# Patient Record
Sex: Male | Born: 1975 | Race: White | Hispanic: No | Marital: Married
Health system: Southern US, Community
[De-identification: ages and names within clinical notes are randomized; demographics above are authoritative.]

## PROBLEM LIST (undated history)

## (undated) DIAGNOSIS — I10 Essential (primary) hypertension: Secondary | ICD-10-CM

## (undated) DIAGNOSIS — F988 Other specified behavioral and emotional disorders with onset usually occurring in childhood and adolescence: Secondary | ICD-10-CM

## (undated) HISTORY — PX: NASAL SINUS SURGERY: SHX719

## (undated) HISTORY — PX: KNEE SURGERY: SHX244

## (undated) HISTORY — PX: BACK SURGERY: SHX140

## (undated) HISTORY — PX: ANKLE SURGERY: SHX546

---

## 2019-01-17 ENCOUNTER — Emergency Department (INDEPENDENT_AMBULATORY_CARE_PROVIDER_SITE_OTHER): Payer: BC Managed Care – PPO

## 2019-01-17 ENCOUNTER — Encounter: Payer: Self-pay | Admitting: Emergency Medicine

## 2019-01-17 ENCOUNTER — Other Ambulatory Visit: Payer: Self-pay

## 2019-01-17 ENCOUNTER — Emergency Department (INDEPENDENT_AMBULATORY_CARE_PROVIDER_SITE_OTHER)
Admission: EM | Admit: 2019-01-17 | Discharge: 2019-01-17 | Disposition: A | Discharge status: Home / Self Care | Payer: BC Managed Care – PPO | Source: Home / Self Care | Attending: Family Medicine | Admitting: Family Medicine

## 2019-01-17 DIAGNOSIS — S93402A Sprain of unspecified ligament of left ankle, initial encounter: Secondary | ICD-10-CM | POA: Diagnosis not present

## 2019-01-17 DIAGNOSIS — S86312A Strain of muscle(s) and tendon(s) of peroneal muscle group at lower leg level, left leg, initial encounter: Secondary | ICD-10-CM

## 2019-01-17 DIAGNOSIS — M25572 Pain in left ankle and joints of left foot: Secondary | ICD-10-CM | POA: Diagnosis not present

## 2019-01-17 HISTORY — DX: Essential (primary) hypertension: I10

## 2019-01-17 HISTORY — DX: Other specified behavioral and emotional disorders with onset usually occurring in childhood and adolescence: F98.8

## 2019-01-17 MED ORDER — HYDROCODONE-ACETAMINOPHEN 5-325 MG PO TABS: ORAL_TABLET | ORAL | 0 refills | Status: AC

## 2019-01-17 MED ORDER — IBUPROFEN 600 MG PO TABS
600.0000 mg | ORAL_TABLET | Freq: Once | ORAL | Status: AC
  Administered 2019-01-17: 600 mg via ORAL

## 2019-01-17 NOTE — Discharge Instructions (Addendum)
Apply ice pack for 30 minutes every 1 to 2 hours today and tomorrow.  Elevate.  Use crutches for 3 to 5 days.  Wear brace for about 2 to 3 weeks.  Begin range of motion and stretching exercises in about 5 days as per instruction sheet. May take Ibuprofen 200mg , 4 tabs every 8 hours with food.

## 2019-01-17 NOTE — ED Triage Notes (Signed)
Patient was sprint-running 3 days ago and since then left lateral ankle pain worsening. Took ibuprofen 800 mg at 0400.  Has had influenza vacc this season. No known contact with covid positive person.

## 2019-01-17 NOTE — ED Provider Notes (Signed)
[facility]    CSN: [id number] Arrival date & time: 01/17/19  1116      History   Chief Complaint Chief Complaint  Patient presents with  . Ankle Injury    left    HPI Jesse Khan is a 44 y.o. male.   After sprinting three days ago, patient experienced pain in his left lateral ankle although he recalls no injury.  The pain is worse with weight bearing and not improved with ibuprofen.  The history is provided by the patient.  Ankle Pain Location:  Ankle Time since incident:  3 days Injury: no   Ankle location:  L ankle Pain details:    Quality:  Aching   Radiates to:  Does not radiate   Severity:  Moderate   Onset quality:  Sudden   Duration:  3 days   Timing:  Constant   Progression:  Unchanged Chronicity:  New Prior injury to area:  No Relieved by:  Nothing Worsened by:  Activity, bearing weight and exercise Ineffective treatments:  NSAIDs Associated symptoms: stiffness and swelling   Associated symptoms: no decreased ROM, no muscle weakness and no numbness     Past Medical History:  Diagnosis Date  . ADD (attention deficit disorder)   . Hypertension     There are no problems to display for this patient.   Past Surgical History:  Procedure Laterality Date  . ANKLE SURGERY Right   . BACK SURGERY    . KNEE SURGERY Right   . NASAL SINUS SURGERY         Home Medications    Prior to Admission medications   Medication Sig Start Date End Date Taking? Authorizing Provider  amitriptyline (ELAVIL) 10 MG tablet Take 5 mg by mouth at bedtime.   Yes [provider]  amlodipine-atorvastatin (CADUET) 10-20 MG tablet Take 1 tablet by mouth daily.   Yes [provider]  amphetamine-dextroamphetamine (ADDERALL) 30 MG tablet Take 30 mg by mouth 2 (two) times daily.   Yes [provider]    Family History No family history on file.  Social History Social History   Tobacco Use  . Smoking status: Not on file    Substance Use Topics  . Alcohol use: Not on file  . Drug use: Not on file     Allergies   Patient has no known allergies.   Review of Systems Review of Systems  Musculoskeletal: Positive for stiffness.  All other systems reviewed and are negative.    Physical Exam Triage Vital Signs ED Triage Vitals  Enc Vitals Group     BP 01/17/19 1410 (!) 138/93     Pulse Rate 01/17/19 1410 84     Resp 01/17/19 1410 16     Temp 01/17/19 1410 98.4 F (36.9 C)     Temp Source 01/17/19 1410 Oral     SpO2 01/17/19 1410 96 %     Weight 01/17/19 1411 215 lb (97.5 kg)     Height 01/17/19 1411 6' (1.829 m)     Head Circumference --      Peak Flow --      Pain Score 01/17/19 1410 8     Pain Loc --      Pain Edu? --      Excl. in [place]? --    No data found.  Updated Vital Signs BP (!) 138/93 (BP Location: Right Arm)   Pulse 84   Temp 98.4 F (36.9 C) (Oral)   Resp  16   Ht 6' (1.829 m)   Wt 97.5 kg   SpO2 96%   BMI 29.16 kg/m   Visual Acuity Right Eye Distance:   Left Eye Distance:   Bilateral Distance:    Right Eye Near:   Left Eye Near:    Bilateral Near:     Physical Exam Vitals and nursing note reviewed.  Constitutional:      General: He is not in acute distress. HENT:     Head: Normocephalic.  Eyes:     Pupils: Pupils are equal, round, and reactive to light.  Cardiovascular:     Rate and Rhythm: Normal rate.  Pulmonary:     Effort: Pulmonary effort is normal.     Breath sounds: Normal breath sounds.  Musculoskeletal:     Cervical back: Normal range of motion.     Right lower leg: No edema.     Left lower leg: No edema.       Feet:     Comments: Left ankle:  Decreased range of motion.  Tenderness and swelling over the lateral malleolus.  Joint stable.  No tenderness over the base of the fifth metatarsal.  Distal neurovascular function is intact.   There is tenderness over the course of the left peroneal tendon.  Pain is elicited with resisted eversion and  resisted plantar flexion of the ankle.   Skin:    General: Skin is warm and dry.  Neurological:     Mental Status: He is alert.      UC Treatments / Results  Labs (all labs ordered are listed, but only abnormal results are displayed) Labs Reviewed - No data to display  EKG   Radiology DG Ankle Complete Left  Result Date: 01/17/2019 CLINICAL DATA:  Running injury 3 days ago with pain, initial encounter EXAM: LEFT ANKLE COMPLETE - 3+ VIEW COMPARISON:  None. FINDINGS: Mild soft tissue swelling is noted laterally. No acute fracture or dislocation is noted. No other focal abnormality is seen. IMPRESSION: Mild soft tissue swelling without acute bony abnormality. Electronically Signed   By: Alcide Clever M.D.   On: 01/17/2019 14:30    Procedures Procedures (including critical care time)  Medications Ordered in UC Medications  ibuprofen (ADVIL) tablet 600 mg (600 mg Oral Given 01/17/19 1409)    Initial Impression / Assessment and Plan / UC Course  I have reviewed the triage vital signs and the nursing notes.  Pertinent labs & imaging results that were available during my care of the patient were reviewed by me and considered in my medical decision making (see chart for details).    Dispensed [employer] stirrup splint. Rx for Lortab at bedtime (#5, no ref) Controlled Substance Prescriptions I have consulted the [place] Controlled Substances Registry for this patient, and feel the risk/benefit ratio today is favorable for proceeding with this prescription for a controlled substance.   Patient has crutches at home. Followup with Dr[facility] ([facility]) if not improving about two weeks.   Final Clinical Impressions(s) / UC Diagnoses   Final diagnoses:  Sprain of left ankle, unspecified ligament, initial encounter  Strain of peroneal tendon of left foot, initial encounter     Discharge Instructions     Apply ice pack for 30 minutes every 1 to 2 hours today and  tomorrow.  Elevate.  Use crutches for 3 to 5 days.  Wear brace for about 2 to 3 weeks.  Begin range of motion and stretching exercises in about 5  days as per instruction sheet. May take Ibuprofen 200mg , 4 tabs every 8 hours with food.      ED Prescriptions    None        , MD 01/24/19 [phone]

## 2020-08-06 IMAGING — DX DG ANKLE COMPLETE 3+V*L*
3 series · 3 of 3 positions shown · non-contrast
Comparison: None.

CLINICAL DATA: Running injury 3 days ago with pain, initial
encounter

EXAM:
LEFT ANKLE COMPLETE - 3+ VIEW

[ankle ap]
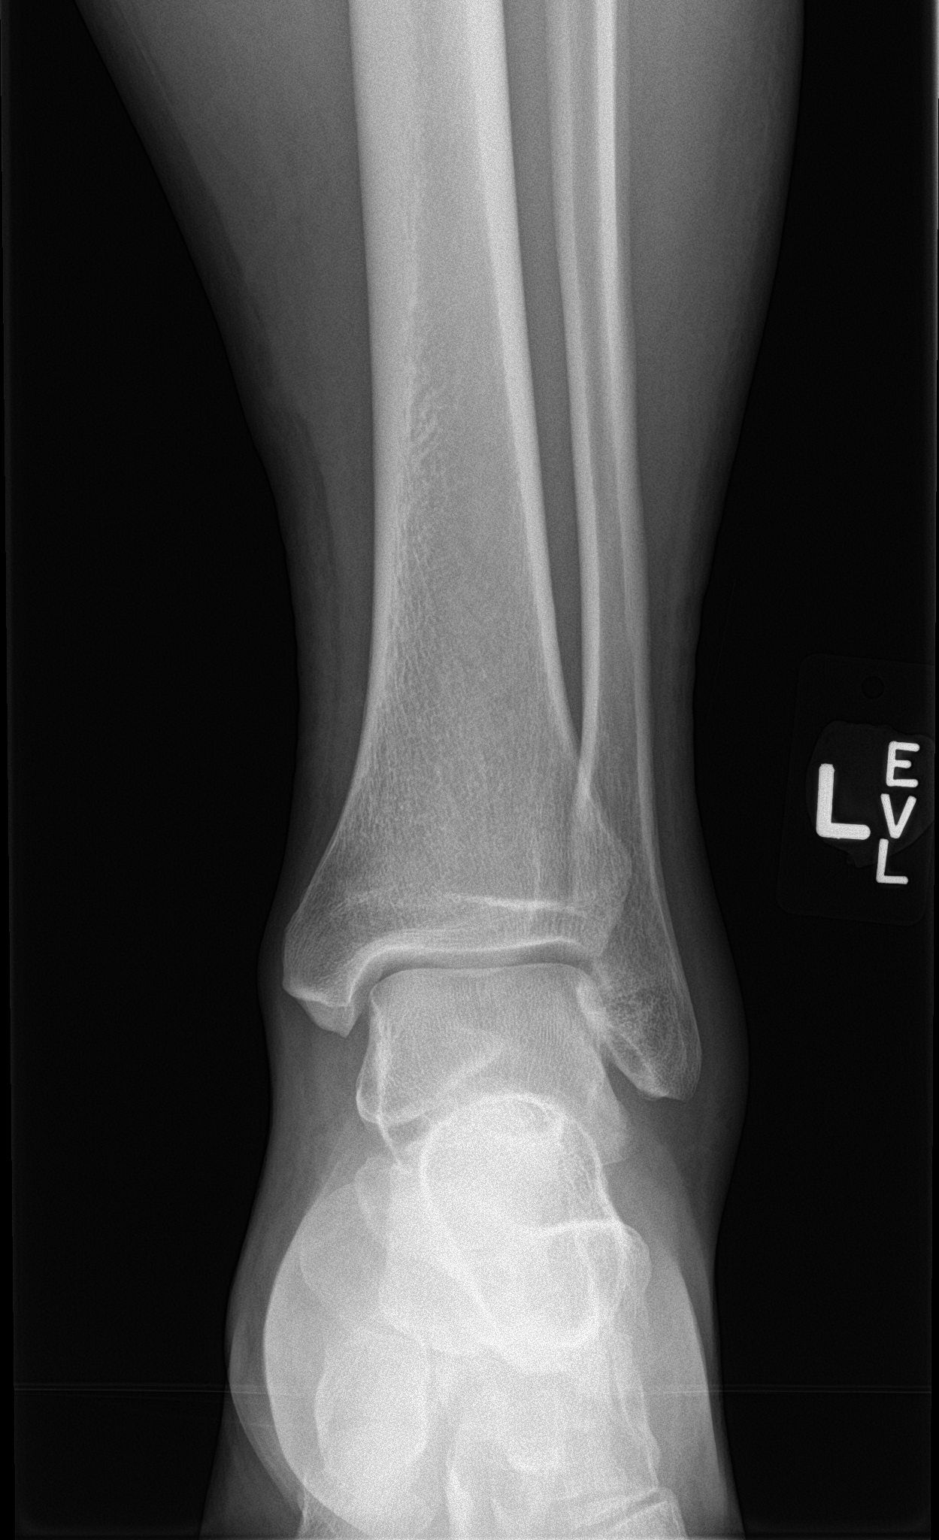

[ankle obl]
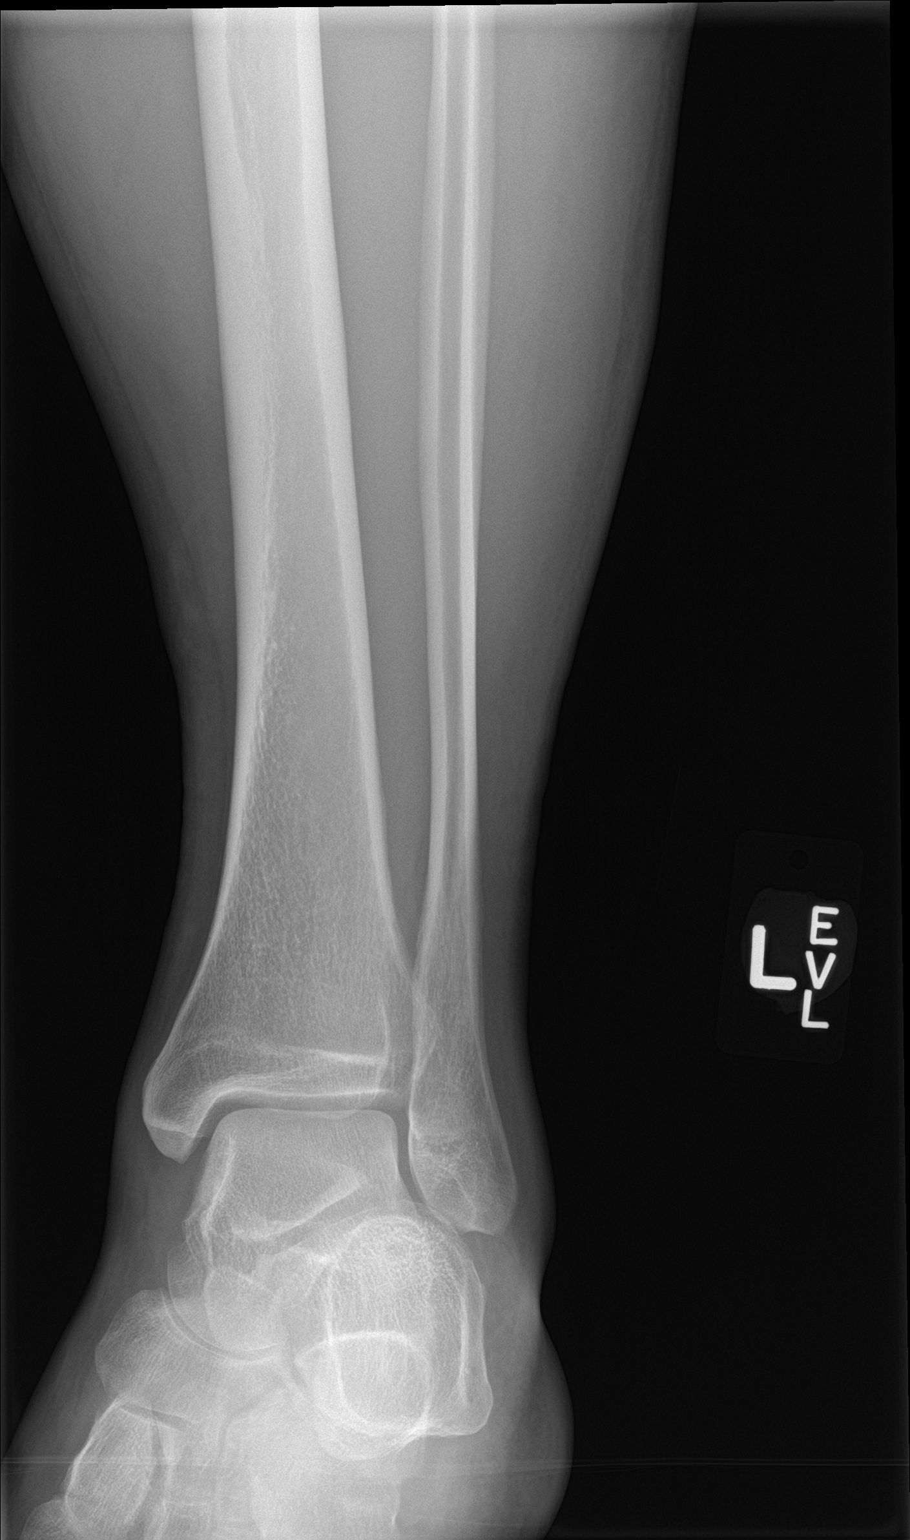

[ankle lat]
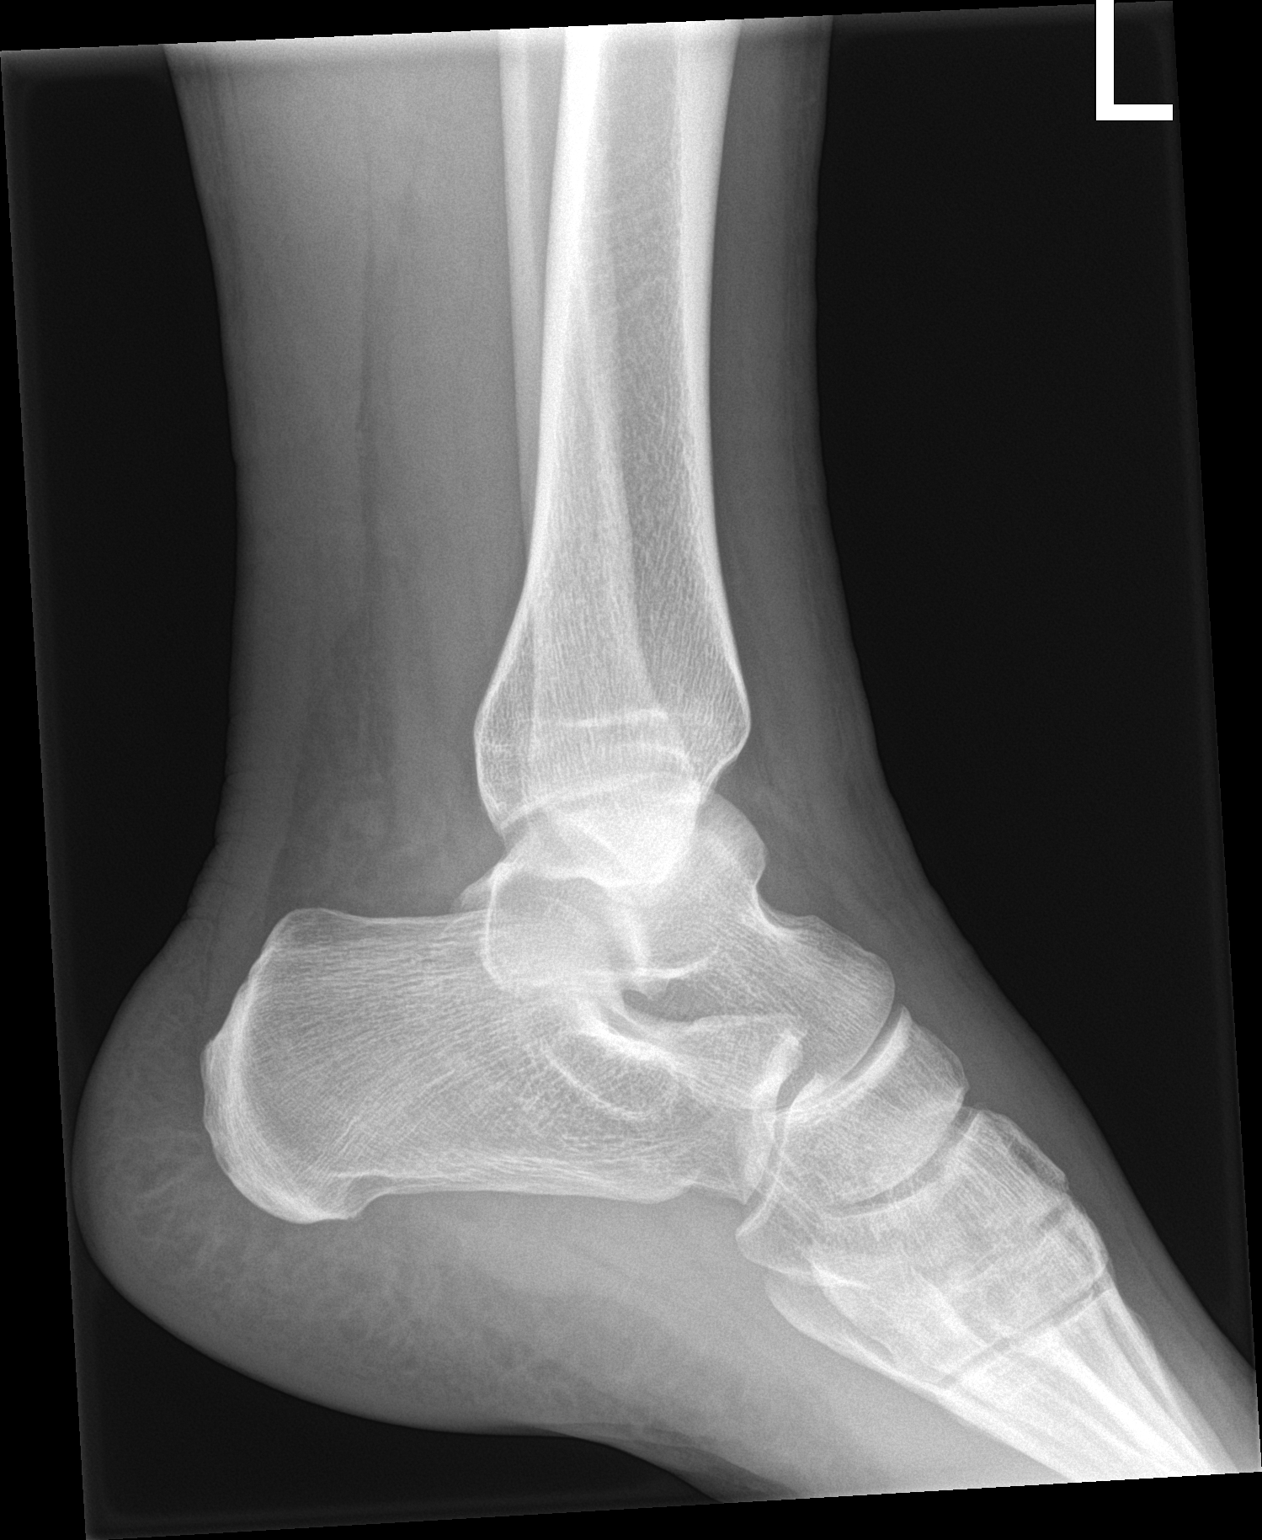

[3 of 3 positions shown; findings below may reference images not displayed]

FINDINGS: Mild soft tissue swelling is noted laterally. No acute fracture or
dislocation is noted. No other focal abnormality is seen.
IMPRESSION: Mild soft tissue swelling without acute bony abnormality.
